# Patient Record
Sex: Male | Born: 1940 | Race: Black or African American | Hispanic: No | Marital: Married
Health system: Southern US, Community
[De-identification: ages and names within clinical notes are randomized; demographics above are authoritative.]

## PROBLEM LIST (undated history)

## (undated) ENCOUNTER — Emergency Department (HOSPITAL_COMMUNITY): Admission: EM | Payer: Self-pay | Source: Home / Self Care

---

## 2016-10-23 ENCOUNTER — Inpatient Hospital Stay
Admission: RE | Admit: 2016-10-23 | Discharge: 2016-11-11 | Disposition: A | Discharge status: Skilled Nursing Facility | Payer: Self-pay | Source: Other Acute Inpatient Hospital | Attending: Internal Medicine | Admitting: Internal Medicine

## 2016-10-23 DIAGNOSIS — J189 Pneumonia, unspecified organism: Secondary | ICD-10-CM

## 2016-10-23 DIAGNOSIS — J9690 Respiratory failure, unspecified, unspecified whether with hypoxia or hypercapnia: Secondary | ICD-10-CM

## 2016-10-23 DIAGNOSIS — Z992 Dependence on renal dialysis: Secondary | ICD-10-CM

## 2016-10-24 ENCOUNTER — Other Ambulatory Visit (HOSPITAL_COMMUNITY): Payer: Self-pay

## 2016-10-24 LAB — CBC WITH DIFFERENTIAL/PLATELET
Basophils Absolute: 0 10*3/uL (ref 0.0–0.1)
Basophils Relative: 0 %
Eosinophils Absolute: 0 10*3/uL (ref 0.0–0.7)
Eosinophils Relative: 0 %
HCT: 24.1 % — ABNORMAL LOW (ref 39.0–52.0)
Hemoglobin: 7.6 g/dL — ABNORMAL LOW (ref 13.0–17.0)
Lymphocytes Relative: 6 %
Lymphs Abs: 0.3 10*3/uL — ABNORMAL LOW (ref 0.7–4.0)
MCH: 29.8 pg (ref 26.0–34.0)
MCHC: 31.5 g/dL (ref 30.0–36.0)
MCV: 94.5 fL (ref 78.0–100.0)
Monocytes Absolute: 0.2 10*3/uL (ref 0.1–1.0)
Monocytes Relative: 4 %
Neutro Abs: 4.3 10*3/uL (ref 1.7–7.7)
Neutrophils Relative %: 90 %
Platelets: 80 10*3/uL — ABNORMAL LOW (ref 150–400)
RBC: 2.55 MIL/uL — ABNORMAL LOW (ref 4.22–5.81)
RDW: 14.3 % (ref 11.5–15.5)
WBC: 4.9 10*3/uL (ref 4.0–10.5)

## 2016-10-24 LAB — BASIC METABOLIC PANEL
Anion gap: 8 (ref 5–15)
BUN: 26 mg/dL — ABNORMAL HIGH (ref 6–20)
CO2: 31 mmol/L (ref 22–32)
Calcium: 8.8 mg/dL — ABNORMAL LOW (ref 8.9–10.3)
Chloride: 97 mmol/L — ABNORMAL LOW (ref 101–111)
Creatinine, Ser: 1.6 mg/dL — ABNORMAL HIGH (ref 0.61–1.24)
GFR calc Af Amer: 47 mL/min — ABNORMAL LOW (ref >60)
GFR calc non Af Amer: 41 mL/min — ABNORMAL LOW (ref >60)
Glucose, Bld: 122 mg/dL — ABNORMAL HIGH (ref 65–99)
Potassium: 4.3 mmol/L (ref 3.5–5.1)
Sodium: 136 mmol/L (ref 135–145)

## 2016-10-25 LAB — RENAL FUNCTION PANEL
ANION GAP: 7 (ref 5–15)
Albumin: 2.6 g/dL — ABNORMAL LOW (ref 3.5–5.0)
BUN: 42 mg/dL — ABNORMAL HIGH (ref 6–20)
CALCIUM: 9 mg/dL (ref 8.9–10.3)
CHLORIDE: 96 mmol/L — AB (ref 101–111)
CO2: 32 mmol/L (ref 22–32)
Creatinine, Ser: 2.4 mg/dL — ABNORMAL HIGH (ref 0.61–1.24)
GFR, EST AFRICAN AMERICAN: 29 mL/min — AB (ref >60)
GFR, EST NON AFRICAN AMERICAN: 25 mL/min — AB (ref >60)
Glucose, Bld: 132 mg/dL — ABNORMAL HIGH (ref 65–99)
Phosphorus: 3.8 mg/dL (ref 2.5–4.6)
Potassium: 4.4 mmol/L (ref 3.5–5.1)
Sodium: 135 mmol/L (ref 135–145)

## 2016-10-25 LAB — CBC
HCT: 26.3 % — ABNORMAL LOW (ref 39.0–52.0)
Hemoglobin: 8.3 g/dL — ABNORMAL LOW (ref 13.0–17.0)
MCH: 30 pg (ref 26.0–34.0)
MCHC: 31.6 g/dL (ref 30.0–36.0)
MCV: 94.9 fL (ref 78.0–100.0)
Platelets: 81 10*3/uL — ABNORMAL LOW (ref 150–400)
RBC: 2.77 MIL/uL — ABNORMAL LOW (ref 4.22–5.81)
RDW: 14.3 % (ref 11.5–15.5)
WBC: 5.6 10*3/uL (ref 4.0–10.5)

## 2016-10-25 LAB — HEMOGLOBIN A1C
HEMOGLOBIN A1C: 5.3 % (ref 4.8–5.6)
MEAN PLASMA GLUCOSE: 105.41 mg/dL

## 2016-10-25 LAB — MAGNESIUM: MAGNESIUM: 1.9 mg/dL (ref 1.7–2.4)

## 2016-10-25 LAB — TSH: TSH: 0.52 u[IU]/mL (ref 0.350–4.500)

## 2016-10-26 LAB — RENAL FUNCTION PANEL
ALBUMIN: 2.5 g/dL — AB (ref 3.5–5.0)
Anion gap: 8 (ref 5–15)
BUN: 62 mg/dL — AB (ref 6–20)
CHLORIDE: 98 mmol/L — AB (ref 101–111)
CO2: 29 mmol/L (ref 22–32)
Calcium: 9.1 mg/dL (ref 8.9–10.3)
Creatinine, Ser: 2.86 mg/dL — ABNORMAL HIGH (ref 0.61–1.24)
GFR calc Af Amer: 23 mL/min — ABNORMAL LOW (ref >60)
GFR calc non Af Amer: 20 mL/min — ABNORMAL LOW (ref >60)
GLUCOSE: 180 mg/dL — AB (ref 65–99)
POTASSIUM: 4.8 mmol/L (ref 3.5–5.1)
Phosphorus: 4 mg/dL (ref 2.5–4.6)
Sodium: 135 mmol/L (ref 135–145)

## 2016-10-26 LAB — CBC
HCT: 27.3 % — ABNORMAL LOW (ref 39.0–52.0)
Hemoglobin: 8.7 g/dL — ABNORMAL LOW (ref 13.0–17.0)
MCH: 30.1 pg (ref 26.0–34.0)
MCHC: 31.9 g/dL (ref 30.0–36.0)
MCV: 94.5 fL (ref 78.0–100.0)
PLATELETS: 83 10*3/uL — AB (ref 150–400)
RBC: 2.89 MIL/uL — ABNORMAL LOW (ref 4.22–5.81)
RDW: 14.7 % (ref 11.5–15.5)
WBC: 5.2 10*3/uL (ref 4.0–10.5)

## 2016-10-26 LAB — MAGNESIUM: Magnesium: 1.8 mg/dL (ref 1.7–2.4)

## 2016-10-26 NOTE — Consult Note (Signed)
[facility] CONSULT NOTE    Date: 10/26/2016                  Patient Name:  Tommy Molina  MRN: [id number]  DOB: 1940/03/01  Age / Sex: 76 y.o., male         PCP: No primary care provider on file.                 Service Requesting Consult: Hospitalist                  Reason for Consult: Acute renal failure             History of Present Illness: Patient is a 76 y.o. 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 Patient was seen at outside hospital initially with substernal chest pain and shortness of breath. He has history of COPD. He had recent left big toe surgery secondary to osteomyelitis and was on cefepime. He subsequently was found to have pulmonary infiltrate. Patient was started on high-dose steroids for the acute eosinophilic pneumonia with good response. Eosinophilic pneumonia was felt to be secondary to daptomycin. He also developed acute kidney injury which could have been acute tubular necrosis versus acute interstitial nephritis. His baseline creatinine appears to be 1.02 from 09/09/2016. He had a right internal jugular PermCath placed for dialysis treatments. Output appears to be increasing at the moment.  Medications: Current medications: Pepcid 20 mg twice a day, Solu-Medrol 125 mg IV every 6 hours, meropenem 500 mg daily, albuterol 3 ML's inhaled 4 times a day, heparin 5000 units subcutaneous every 8 hours, PhosLo 667 mg by mouth 3 times a day, BuSpar 5 mg 3 times a day, senna 1 tablet by mouth twice a day, multivitamin 1 tablet daily, Flomax 0.4 mg daily, MiraLAX 17 g by mouth daily, breo ellipta 100/29mg 1 puff inhaled daily.  Allergies: Aspirin, daptomycin which caused probable eosinophilic pneumonia, naproxen   Past  Medical History: acute eosinophilic pneumonia secondary to daptomycin, multifocal pneumonia, acute respiratory failure, acute kidney injury on chronic kidney disease, and COPD  Past Surgical History: Left toe surgery PermCath placement   Family History: Positive for CHF and hypertension  Social History: Lives at home with his wife. No tobacco, alcohol, illicit drug use.   Review of Systems: Review of Systems  Constitutional: Positive for malaise/fatigue. Negative for chills and fever.  HENT: Positive for hearing loss. Negative for congestion, nosebleeds and tinnitus.   Eyes: Negative for blurred vision.  Respiratory: Positive for cough and shortness of breath.   Cardiovascular: Positive for orthopnea. Negative for chest pain and palpitations.  Gastrointestinal: Negative for heartburn and vomiting.  Genitourinary: Negative for dysuria, frequency and urgency.  Musculoskeletal: Positive for back pain. Negative for myalgias.  Skin: Negative for itching and rash.  Neurological: Negative for dizziness and focal weakness.  Endo/Heme/Allergies: Negative for polydipsia. Does not bruise/bleed easily.  Psychiatric/Behavioral: Negative for depression. The patient is not nervous/anxious.     Vital Signs: There were no vitals taken for this visit.  Weight trends: There were no vitals filed for this visit.  Physical Exam: General: NAD, laying in bed  Head: Normocephalic, atraumatic.  Eyes: Anicteric, EOMI  Nose: Mucous membranes moist, not inflammed, nonerythematous.  Throat: Oropharynx nonerythematous, no exudate appreciated.   Neck:  Supple, trachea midline.  Lungs:  Scattered rhonchi, normal effort  Heart: RRR. S1 and S2 normal without gallop, murmur, or rubs.  Abdomen:  BS normoactive. Soft, Nondistended, non-tender.  No masses or organomegaly.  Extremities: 1+ b/l LE edema.  Neurologic: A&O X3, Motor strength is 5/5 in the all 4 extremities  Skin: No visible rashes, scars.     Lab results: Basic Metabolic Panel:  Recent Labs Lab 10/24/16 0515 10/25/16 0603 10/26/16 0512  NA 136 135 135  K 4.3 4.4 4.8  CL 97* 96* 98*  CO2 31 32 29  GLUCOSE 122* 132* 180*  BUN 26* 42* 62*  CREATININE 1.60* 2.40* 2.86*  CALCIUM 8.8* 9.0 9.1  MG  --  1.9 1.8  PHOS  --  3.8 4.0    Liver Function Tests:  Recent Labs Lab 10/25/16 0603 10/26/16 0512  ALBUMIN 2.6* 2.5*   No results for input(s): LIPASE, AMYLASE in the last 168 hours. No results for input(s): AMMONIA in the last 168 hours.  CBC:  Recent Labs Lab 10/24/16 0515 10/25/16 0603 10/26/16 0512  WBC 4.9 5.6 5.2  NEUTROABS 4.3  --   --   HGB 7.6* 8.3* 8.7*  HCT 24.1* 26.3* 27.3*  MCV 94.5 94.9 94.5  PLT 80* 81* 83*    Cardiac Enzymes: No results for input(s): CKTOTAL, CKMB, CKMBINDEX, TROPONINI in the last 168 hours.  BNP: Invalid input(s): POCBNP  CBG: No results for input(s): GLUCAP in the last 168 hours.  Microbiology: No results found for this or any previous visit.  Coagulation Studies: No results for input(s): LABPROT, INR in the last 72 hours.  Urinalysis: No results for input(s): COLORURINE, LABSPEC, PHURINE, GLUCOSEU, HGBUR, BILIRUBINUR, KETONESUR, PROTEINUR, UROBILINOGEN, NITRITE, LEUKOCYTESUR in the last 72 hours.  Invalid input(s): APPERANCEUR    Imaging:  No results found.   Assessment & Plan: Pt is a 76 y.o. 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  1. Acute renal failure. This is felt to be secondary to acute tubular necrosis versus allergic interstitial nephritis possibly due to daptomycin. Patient states that his urine output is increasing.  The patient's creatinine is currently up to 2.86 with an EGFR of 20. Hold off on dialysis for  now. Reassess for need on Wednesday.  2. Anemia unspecified. Hemoglobin currently 8.7. It has been as low as 7.6. Continue to monitor CBC.  3. Secondary hyperparathyroidism. Patient was started on PhosLo at the outside hospital. Continue to periodically monitor serum phosphorus, calcium. Okay to continue calcium acetate for now.  4. Thanks for consultation.

## 2016-10-28 LAB — COMPREHENSIVE METABOLIC PANEL
ALT: 52 U/L (ref 17–63)
ANION GAP: 8 (ref 5–15)
AST: 23 U/L (ref 15–41)
Albumin: 2.6 g/dL — ABNORMAL LOW (ref 3.5–5.0)
Alkaline Phosphatase: 105 U/L (ref 38–126)
BUN: 110 mg/dL — AB (ref 6–20)
CALCIUM: 9.3 mg/dL (ref 8.9–10.3)
CO2: 31 mmol/L (ref 22–32)
Chloride: 99 mmol/L — ABNORMAL LOW (ref 101–111)
Creatinine, Ser: 2.61 mg/dL — ABNORMAL HIGH (ref 0.61–1.24)
GFR, EST AFRICAN AMERICAN: 26 mL/min — AB (ref >60)
GFR, EST NON AFRICAN AMERICAN: 22 mL/min — AB (ref >60)
Glucose, Bld: 154 mg/dL — ABNORMAL HIGH (ref 65–99)
Potassium: 5.2 mmol/L — ABNORMAL HIGH (ref 3.5–5.1)
SODIUM: 138 mmol/L (ref 135–145)
Total Bilirubin: 0.6 mg/dL (ref 0.3–1.2)
Total Protein: 5.1 g/dL — ABNORMAL LOW (ref 6.5–8.1)

## 2016-10-28 LAB — CBC
HCT: 25.9 % — ABNORMAL LOW (ref 39.0–52.0)
Hemoglobin: 8.3 g/dL — ABNORMAL LOW (ref 13.0–17.0)
MCH: 30 pg (ref 26.0–34.0)
MCHC: 32 g/dL (ref 30.0–36.0)
MCV: 93.5 fL (ref 78.0–100.0)
PLATELETS: 107 10*3/uL — AB (ref 150–400)
RBC: 2.77 MIL/uL — AB (ref 4.22–5.81)
RDW: 14.6 % (ref 11.5–15.5)
WBC: 7.3 10*3/uL (ref 4.0–10.5)

## 2016-10-28 NOTE — Progress Notes (Signed)
[facility]  ROUNDING NOTE   Subjective:  Patient with worsening respiratory status today. Next line he also has increasing lower extremity edema. Therefore we have decided to proceed with dialysis today.   Objective:  Vital signs in last 24 hours:  Temperature 96.8 pulse 104 respirations 20 blood pressure 123/59 Physical Exam: General: Increased work of breathing   Head: Normocephalic, atraumatic. Moist oral mucosal membranes  Eyes: Anicteric  Neck: Supple, trachea midline  Lungs:  Bilateral rales, increased work of breathing   Heart: S1S2 no rubs  Abdomen:  Soft, nontender, bowel sounds present  Extremities: 2+ peripheral edema.  Neurologic: Awake, alert, following commands  Skin: No lesions  Access: permcath in place    Basic Metabolic Panel:  Recent Labs Lab 10/24/16 0515 10/25/16 0603 10/26/16 0512 10/28/16 1250  NA 136 135 135 138  K 4.3 4.4 4.8 5.2*  CL 97* 96* 98* 99*  CO2 31 32 29 31  GLUCOSE 122* 132* 180* 154*  BUN 26* 42* 62* 110*  CREATININE 1.60* 2.40* 2.86* 2.61*  CALCIUM 8.8* 9.0 9.1 9.3  MG  --  1.9 1.8  --   PHOS  --  3.8 4.0  --     Liver Function Tests:  Recent Labs Lab 10/25/16 0603 10/26/16 0512 10/28/16 1250  AST  --   --  23  ALT  --   --  52  ALKPHOS  --   --  105  BILITOT  --   --  0.6  PROT  --   --  5.1*  ALBUMIN 2.6* 2.5* 2.6*   No results for input(s): LIPASE, AMYLASE in the last 168 hours. No results for input(s): AMMONIA in the last 168 hours.  CBC:  Recent Labs Lab 10/24/16 0515 10/25/16 0603 10/26/16 0512 10/28/16 1250  WBC 4.9 5.6 5.2 7.3  NEUTROABS 4.3  --   --   --   HGB 7.6* 8.3* 8.7* 8.3*  HCT 24.1* 26.3* 27.3* 25.9*  MCV 94.5 94.9 94.5 93.5  PLT 80* 81* 83* 107*    Cardiac Enzymes: No results for input(s): CKTOTAL, CKMB, CKMBINDEX, TROPONINI in the last 168 hours.  BNP: Invalid input(s): POCBNP  CBG: No results for input(s): GLUCAP in the last 168 hours.  Microbiology: No  results found for this or any previous visit.  Coagulation Studies: No results for input(s): LABPROT, INR in the last 72 hours.  Urinalysis: No results for input(s): COLORURINE, LABSPEC, PHURINE, GLUCOSEU, HGBUR, BILIRUBINUR, KETONESUR, PROTEINUR, UROBILINOGEN, NITRITE, LEUKOCYTESUR in the last 72 hours.  Invalid input(s): APPERANCEUR    Imaging: No results found.   Medications:       Assessment/ Plan:  76 y.o. 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  1. Acute renal failure.  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 - Renal function worsening. We're hoping to keep the patient off of dialysis however it appears that he needs dialysis at this time. We will plan for a short dialysis treatment of 2 hours today with ultrafiltration target of 11.5 kg. We will plan for dialysis again tomorrow as well.  2. Anemia unspecified. Hemoglobin currently down to 8.3. Continue to monitor CBC.  3. Secondary hyperparathyroidism. Phosphorus 4.0 at last check. The patient is maintaining on calcium  acetate. Continue to monitor bone mineral metabolism parameters.  4. Lower extremity edema.  Ultrafiltration target 1-1.5 kg today.   LOS: 0 Tommy Molina 9/12/20182:54 PM

## 2016-10-29 LAB — HEPATITIS B SURFACE ANTIBODY,QUALITATIVE: HEP B S AB: NONREACTIVE

## 2016-10-29 LAB — RENAL FUNCTION PANEL
ANION GAP: 9 (ref 5–15)
Albumin: 2.6 g/dL — ABNORMAL LOW (ref 3.5–5.0)
BUN: 84 mg/dL — AB (ref 6–20)
CO2: 28 mmol/L (ref 22–32)
Calcium: 9 mg/dL (ref 8.9–10.3)
Chloride: 102 mmol/L (ref 101–111)
Creatinine, Ser: 1.9 mg/dL — ABNORMAL HIGH (ref 0.61–1.24)
GFR calc Af Amer: 38 mL/min — ABNORMAL LOW (ref >60)
GFR calc non Af Amer: 33 mL/min — ABNORMAL LOW (ref >60)
GLUCOSE: 119 mg/dL — AB (ref 65–99)
POTASSIUM: 5.5 mmol/L — AB (ref 3.5–5.1)
Phosphorus: 3.9 mg/dL (ref 2.5–4.6)
SODIUM: 139 mmol/L (ref 135–145)

## 2016-10-29 LAB — CBC
HCT: 26.8 % — ABNORMAL LOW (ref 39.0–52.0)
HEMOGLOBIN: 8.9 g/dL — AB (ref 13.0–17.0)
MCH: 31 pg (ref 26.0–34.0)
MCHC: 33.2 g/dL (ref 30.0–36.0)
MCV: 93.4 fL (ref 78.0–100.0)
Platelets: 109 10*3/uL — ABNORMAL LOW (ref 150–400)
RBC: 2.87 MIL/uL — ABNORMAL LOW (ref 4.22–5.81)
RDW: 14.8 % (ref 11.5–15.5)
WBC: 5.1 10*3/uL (ref 4.0–10.5)

## 2016-10-29 LAB — HEPATITIS B SURFACE ANTIGEN: Hepatitis B Surface Ag: NEGATIVE

## 2016-10-29 LAB — HEPATITIS B CORE ANTIBODY, IGM: HEP B C IGM: NEGATIVE

## 2016-10-30 LAB — CBC
HEMATOCRIT: 24.3 % — AB (ref 39.0–52.0)
HEMOGLOBIN: 7.7 g/dL — AB (ref 13.0–17.0)
MCH: 30 pg (ref 26.0–34.0)
MCHC: 31.7 g/dL (ref 30.0–36.0)
MCV: 94.6 fL (ref 78.0–100.0)
Platelets: 109 10*3/uL — ABNORMAL LOW (ref 150–400)
RBC: 2.57 MIL/uL — ABNORMAL LOW (ref 4.22–5.81)
RDW: 15 % (ref 11.5–15.5)
WBC: 3.9 10*3/uL — ABNORMAL LOW (ref 4.0–10.5)

## 2016-10-30 LAB — RENAL FUNCTION PANEL
ALBUMIN: 2.3 g/dL — AB (ref 3.5–5.0)
ANION GAP: 3 — AB (ref 5–15)
BUN: 60 mg/dL — ABNORMAL HIGH (ref 6–20)
CO2: 32 mmol/L (ref 22–32)
Calcium: 8.8 mg/dL — ABNORMAL LOW (ref 8.9–10.3)
Chloride: 105 mmol/L (ref 101–111)
Creatinine, Ser: 1.63 mg/dL — ABNORMAL HIGH (ref 0.61–1.24)
GFR calc Af Amer: 46 mL/min — ABNORMAL LOW (ref >60)
GFR, EST NON AFRICAN AMERICAN: 40 mL/min — AB (ref >60)
GLUCOSE: 78 mg/dL (ref 65–99)
PHOSPHORUS: 3.4 mg/dL (ref 2.5–4.6)
POTASSIUM: 5 mmol/L (ref 3.5–5.1)
Sodium: 140 mmol/L (ref 135–145)

## 2016-10-30 LAB — BASIC METABOLIC PANEL
Anion gap: 5 (ref 5–15)
BUN: 59 mg/dL — ABNORMAL HIGH (ref 6–20)
CHLORIDE: 103 mmol/L (ref 101–111)
CO2: 31 mmol/L (ref 22–32)
CREATININE: 1.62 mg/dL — AB (ref 0.61–1.24)
Calcium: 8.7 mg/dL — ABNORMAL LOW (ref 8.9–10.3)
GFR calc non Af Amer: 40 mL/min — ABNORMAL LOW (ref >60)
GFR, EST AFRICAN AMERICAN: 46 mL/min — AB (ref >60)
Glucose, Bld: 80 mg/dL (ref 65–99)
Potassium: 5 mmol/L (ref 3.5–5.1)
Sodium: 139 mmol/L (ref 135–145)

## 2016-10-30 NOTE — Progress Notes (Signed)
[facility]  ROUNDING NOTE   Subjective:  Patient completed hemodialysis yesterday. He tolerated this quite well. Lower extremity edema has improved. We plan for dialysis again today.   Objective:  Vital signs in last 24 hours:  Temperature 97.8 pulse 80 respirations 14 blood pressure 134/64  Physical Exam: General: No acute distress   Head: Normocephalic, atraumatic. Moist oral mucosal membranes  Eyes: Anicteric  Neck: Supple, trachea midline  Lungs:  Bilateral rales, No acute distress   Heart: S1S2 no rubs  Abdomen:  Soft, nontender, bowel sounds present  Extremities: 2+ peripheral edema.  Neurologic: Awake, alert, following commands  Skin: No lesions  Access: permcath in place    Basic Metabolic Panel:  Recent Labs Lab 10/25/16 0603 10/26/16 0512 10/28/16 1250 10/29/16 0609 10/30/16 0710  NA 135 135 138 139 139  K 4.4 4.8 5.2* 5.5* 5.0  CL 96* 98* 99* 102 103  CO2 32 GLUCOSE 132* 180* 154* 119* 80  BUN 42* 62* 110* 84* 59*  CREATININE 2.40* 2.86* 2.61* 1.90* 1.62*  CALCIUM 9.0 9.1 9.3 9.0 8.7*  MG 1.9 1.8  --   --   --   PHOS 3.8 4.0  --  3.9  --     Liver Function Tests:  Recent Labs Lab 10/25/16 0603 10/26/16 0512 10/28/16 1250 10/29/16 0609  AST  --   --  23  --   ALT  --   --  52  --   ALKPHOS  --   --  105  --   BILITOT  --   --  0.6  --   PROT  --   --  5.1*  --   ALBUMIN 2.6* 2.5* 2.6* 2.6*   No results for input(s): LIPASE, AMYLASE in the last 168 hours. No results for input(s): AMMONIA in the last 168 hours.  CBC:  Recent Labs Lab 10/24/16 0515 10/25/16 0603 10/26/16 0512 10/28/16 1250 10/29/16 0609 10/30/16 0710  WBC 4.9 5.6 5.2 7.3 5.1 3.9*  NEUTROABS 4.3  --   --   --   --   --   HGB 7.6* 8.3* 8.7* 8.3* 8.9* 7.7*  HCT 24.1* 26.3* 27.3* 25.9* 26.8* 24.3*  MCV 94.5 94.9 94.5 93.5 93.4 94.6  PLT 80* 81* 83* 107* 109* 109*    Cardiac Enzymes: No results for input(s): CKTOTAL, CKMB, CKMBINDEX,  TROPONINI in the last 168 hours.  BNP: Invalid input(s): POCBNP  CBG: No results for input(s): GLUCAP in the last 168 hours.  Microbiology: No results found for this or any previous visit.  Coagulation Studies: No results for input(s): LABPROT, INR in the last 72 hours.  Urinalysis: No results for input(s): COLORURINE, LABSPEC, PHURINE, GLUCOSEU, HGBUR, BILIRUBINUR, KETONESUR, PROTEINUR, UROBILINOGEN, NITRITE, LEUKOCYTESUR in the last 72 hours.  Invalid input(s): APPERANCEUR    Imaging: No results found.   Medications:       Assessment/ Plan:  76 y.o. 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  1. Acute renal failure.  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 - Patient status has improved significantly with reinitiation of dialysis. We will plan for another dialysis treatment today with ultra filtration target of 1.5 kg.  2. Anemia unspecified. Hemoglobin down  to 7.7 at the moment. Consider blood transfusion for hemoglobin of 7 or less.  3. Secondary hyperparathyroidism. Phosphorus well controlled at 3.9. Continue to monitor.  4. Lower extremity edema.  Overall still has significant edema but appears to be less tight. Ultrafiltration target 1.5 kg today.   LOS: 0 Amenah Tucci 9/14/201810:05 AM

## 2016-11-02 LAB — RENAL FUNCTION PANEL
Albumin: 2.4 g/dL — ABNORMAL LOW (ref 3.5–5.0)
Anion gap: 4 — ABNORMAL LOW (ref 5–15)
BUN: 44 mg/dL — ABNORMAL HIGH (ref 6–20)
CHLORIDE: 104 mmol/L (ref 101–111)
CO2: 33 mmol/L — ABNORMAL HIGH (ref 22–32)
CREATININE: 1.31 mg/dL — AB (ref 0.61–1.24)
Calcium: 8.7 mg/dL — ABNORMAL LOW (ref 8.9–10.3)
GFR, EST AFRICAN AMERICAN: 60 mL/min — AB (ref >60)
GFR, EST NON AFRICAN AMERICAN: 52 mL/min — AB (ref >60)
Glucose, Bld: 81 mg/dL (ref 65–99)
POTASSIUM: 5.1 mmol/L (ref 3.5–5.1)
Phosphorus: 3.2 mg/dL (ref 2.5–4.6)
Sodium: 141 mmol/L (ref 135–145)

## 2016-11-02 LAB — CBC
HEMATOCRIT: 23.5 % — AB (ref 39.0–52.0)
Hemoglobin: 7.7 g/dL — ABNORMAL LOW (ref 13.0–17.0)
MCH: 30.9 pg (ref 26.0–34.0)
MCHC: 32.8 g/dL (ref 30.0–36.0)
MCV: 94.4 fL (ref 78.0–100.0)
Platelets: 118 10*3/uL — ABNORMAL LOW (ref 150–400)
RBC: 2.49 MIL/uL — AB (ref 4.22–5.81)
RDW: 15.2 % (ref 11.5–15.5)
WBC: 4.3 10*3/uL (ref 4.0–10.5)

## 2016-11-02 NOTE — Progress Notes (Signed)
[facility]  ROUNDING NOTE   Subjective:  Patient seen and evaluated during hemodialysis. Per his report he is still making approximately 700 cc of urine per day.   Objective:  Vital signs in last 24 hours:  Temperature 98.7 pulse 96 respirations 14 blood pressure 153/71  Physical Exam: General: No acute distress   Head: Normocephalic, atraumatic. Moist oral mucosal membranes  Eyes: Anicteric  Neck: Supple, trachea midline  Lungs:  Clear to auscultation bilateral, normal respiratory effort   Heart: S1S2 no rubs  Abdomen:  Soft, nontender, bowel sounds present  Extremities: Trace peripheral edema.  Neurologic: Awake, alert, following commands  Skin: No lesions  Access: permcath in place    Basic Metabolic Panel:  Recent Labs Lab 10/28/16 1250 10/29/16 0609 10/30/16 0710 10/30/16 1424 11/02/16 0607  NA 138 139 139 140 141  K 5.2* 5.5* 5.0 5.0 5.1  CL 99* 102 103 105 104  CO2 32 33*  GLUCOSE 154* 119* 80 78 81  BUN 110* 84* 59* 60* 44*  CREATININE 2.61* 1.90* 1.62* 1.63* 1.31*  CALCIUM 9.3 9.0 8.7* 8.8* 8.7*  PHOS  --  3.9  --  3.4 3.2    Liver Function Tests:  Recent Labs Lab 10/28/16 1250 10/29/16 0609 10/30/16 1424 11/02/16 0607  AST 23  --   --   --   ALT 52  --   --   --   ALKPHOS 105  --   --   --   BILITOT 0.6  --   --   --   PROT 5.1*  --   --   --   ALBUMIN 2.6* 2.6* 2.3* 2.4*   No results for input(s): LIPASE, AMYLASE in the last 168 hours. No results for input(s): AMMONIA in the last 168 hours.  CBC:  Recent Labs Lab 10/28/16 1250 10/29/16 0609 10/30/16 0710 11/02/16 0542  WBC 7.3 5.1 3.9* 4.3  HGB 8.3* 8.9* 7.7* 7.7*  HCT 25.9* 26.8* 24.3* 23.5*  MCV 93.5 93.4 94.6 94.4  PLT 107* 109* 109* 118*    Cardiac Enzymes: No results for input(s): CKTOTAL, CKMB, CKMBINDEX, TROPONINI in the last 168 hours.  BNP: Invalid input(s): POCBNP  CBG: No results for input(s): GLUCAP in the last 168  hours.  Microbiology: No results found for this or any previous visit.  Coagulation Studies: No results for input(s): LABPROT, INR in the last 72 hours.  Urinalysis: No results for input(s): COLORURINE, LABSPEC, PHURINE, GLUCOSEU, HGBUR, BILIRUBINUR, KETONESUR, PROTEINUR, UROBILINOGEN, NITRITE, LEUKOCYTESUR in the last 72 hours.  Invalid input(s): APPERANCEUR    Imaging: No results found.   Medications:       Assessment/ Plan:  76 y.o. 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  1. Acute renal failure.  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 - BUN and creatinine trending down under the influence of dialysis.  Still making little urine however. Tentatively we will plan for another dialysis session on Wednesday with ultrafiltration target of 1 kg. Thereafter we may hold further dialysis treatment.  2. Anemia unspecified. Hemoglobin 7.7 at last check. No urgent indication for transfusion at the moment.  3. Secondary hyperparathyroidism. Phosphorus at target at 3.2 with a calcium of 8.7. Continue to monitor.  4. Lower extremity edema. Remarkably improved. We will plan for 1 additional dialysis session with ultrafiltration on Wednesday.   LOS: 0 Diontae Route 9/17/20185:06 PM

## 2016-11-04 LAB — RENAL FUNCTION PANEL
ANION GAP: 4 — AB (ref 5–15)
Albumin: 2.8 g/dL — ABNORMAL LOW (ref 3.5–5.0)
BUN: 28 mg/dL — AB (ref 6–20)
CHLORIDE: 102 mmol/L (ref 101–111)
CO2: 33 mmol/L — ABNORMAL HIGH (ref 22–32)
Calcium: 8.9 mg/dL (ref 8.9–10.3)
Creatinine, Ser: 1.09 mg/dL (ref 0.61–1.24)
GFR calc Af Amer: >60 mL/min (ref >60)
Glucose, Bld: 96 mg/dL (ref 65–99)
PHOSPHORUS: 2.7 mg/dL (ref 2.5–4.6)
POTASSIUM: 4.1 mmol/L (ref 3.5–5.1)
Sodium: 139 mmol/L (ref 135–145)

## 2016-11-04 LAB — CBC
HCT: 25.5 % — ABNORMAL LOW (ref 39.0–52.0)
HEMOGLOBIN: 8 g/dL — AB (ref 13.0–17.0)
MCH: 29.9 pg (ref 26.0–34.0)
MCHC: 31.4 g/dL (ref 30.0–36.0)
MCV: 95.1 fL (ref 78.0–100.0)
Platelets: 162 10*3/uL (ref 150–400)
RBC: 2.68 MIL/uL — ABNORMAL LOW (ref 4.22–5.81)
RDW: 15.2 % (ref 11.5–15.5)
WBC: 7.4 10*3/uL (ref 4.0–10.5)

## 2016-11-04 NOTE — Progress Notes (Signed)
[facility]  ROUNDING NOTE   Subjective:  Creatinine continues to drop. Next line BUN currently 28 with a creatinine 1.09. He did undergo dialysis today.   Objective:  Vital signs in last 24 hours:  Temperature 97.6 pulse 88 respirations 18 blood pressure 114/50  Physical Exam: General: No acute distress   Head: Normocephalic, atraumatic. Moist oral mucosal membranes  Eyes: Anicteric  Neck: Supple, trachea midline  Lungs:  Clear to auscultation bilateral, normal respiratory effort   Heart: S1S2 no rubs  Abdomen:  Soft, nontender, bowel sounds present  Extremities: Trace peripheral edema.  Neurologic: Awake, alert, following commands  Skin: No lesions  Access: permcath in place    Basic Metabolic Panel:  Recent Labs Lab 10/29/16 0609 10/30/16 0710 10/30/16 1424 11/02/16 0607 11/04/16 0618  NA 139 139 140 141 139  K 5.5* 5.0 5.0 5.1 4.1  CL 102 103 105 104 102  CO2 28 31 32 33* 33*  GLUCOSE 119* 80 78 81 96  BUN 84* 59* 60* 44* 28*  CREATININE 1.90* 1.62* 1.63* 1.31* 1.09  CALCIUM 9.0 8.7* 8.8* 8.7* 8.9  PHOS 3.9  --  3.4 3.2 2.7    Liver Function Tests:  Recent Labs Lab 10/28/16 1250 10/29/16 0609 10/30/16 1424 11/02/16 0607 11/04/16 0618  AST 23  --   --   --   --   ALT 52  --   --   --   --   ALKPHOS 105  --   --   --   --   BILITOT 0.6  --   --   --   --   PROT 5.1*  --   --   --   --   ALBUMIN 2.6* 2.6* 2.3* 2.4* 2.8*   No results for input(s): LIPASE, AMYLASE in the last 168 hours. No results for input(s): AMMONIA in the last 168 hours.  CBC:  Recent Labs Lab 10/28/16 1250 10/29/16 0609 10/30/16 0710 11/02/16 0542 11/04/16 0618  WBC 7.3 5.1 3.9* 4.3 7.4  HGB 8.3* 8.9* 7.7* 7.7* 8.0*  HCT 25.9* 26.8* 24.3* 23.5* 25.5*  MCV 93.5 93.4 94.6 94.4 95.1  PLT 107* 109* 109* 118* 162    Cardiac Enzymes: No results for input(s): CKTOTAL, CKMB, CKMBINDEX, TROPONINI in the last 168 hours.  BNP: Invalid input(s):  POCBNP  CBG: No results for input(s): GLUCAP in the last 168 hours.  Microbiology: No results found for this or any previous visit.  Coagulation Studies: No results for input(s): LABPROT, INR in the last 72 hours.  Urinalysis: No results for input(s): COLORURINE, LABSPEC, PHURINE, GLUCOSEU, HGBUR, BILIRUBINUR, KETONESUR, PROTEINUR, UROBILINOGEN, NITRITE, LEUKOCYTESUR in the last 72 hours.  Invalid input(s): APPERANCEUR    Imaging: No results found.   Medications:       Assessment/ Plan:  76 y.o. 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  1. Acute renal failure.  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 - BUN and creatinine continue to trend down. Creatinine down to 1.09. The patient is making some urine. We will hold further dialysis treatment for now.  2. Anemia unspecified. Hemoglobin currently 8.0. Hold off on Aranesp for now.  3. Secondary hyperparathyroidism. Phosphorus acceptable at 2.7. Continue to monitor.  4. Lower  extremity edema. Significantly improved with dialysis. We will continue to monitor clinically.   LOS: 0 Sanjith Siwek 9/19/201811:45 AM

## 2016-11-06 LAB — RENAL FUNCTION PANEL
ALBUMIN: 2.8 g/dL — AB (ref 3.5–5.0)
ANION GAP: 8 (ref 5–15)
BUN: 33 mg/dL — ABNORMAL HIGH (ref 6–20)
CALCIUM: 9.1 mg/dL (ref 8.9–10.3)
CO2: 29 mmol/L (ref 22–32)
Chloride: 103 mmol/L (ref 101–111)
Creatinine, Ser: 1.03 mg/dL (ref 0.61–1.24)
GFR calc non Af Amer: >60 mL/min (ref >60)
Glucose, Bld: 104 mg/dL — ABNORMAL HIGH (ref 65–99)
PHOSPHORUS: 2.5 mg/dL (ref 2.5–4.6)
POTASSIUM: 3.8 mmol/L (ref 3.5–5.1)
SODIUM: 140 mmol/L (ref 135–145)

## 2016-11-06 LAB — CBC
HEMATOCRIT: 23.4 % — AB (ref 39.0–52.0)
HEMOGLOBIN: 7.6 g/dL — AB (ref 13.0–17.0)
MCH: 31 pg (ref 26.0–34.0)
MCHC: 32.5 g/dL (ref 30.0–36.0)
MCV: 95.5 fL (ref 78.0–100.0)
Platelets: 136 10*3/uL — ABNORMAL LOW (ref 150–400)
RBC: 2.45 MIL/uL — AB (ref 4.22–5.81)
RDW: 15.7 % — ABNORMAL HIGH (ref 11.5–15.5)
WBC: 5.6 10*3/uL (ref 4.0–10.5)

## 2016-11-06 NOTE — Progress Notes (Signed)
  [facility]  ROUNDING NOTE   Subjective:  Renal function significantly improved. Patient states that he is still making urine. Creatinine down to 1.03.    Objective:  Vital signs in last 24 hours:  Temperature 90.8 pulse 87 respirations 22 blood pressure 126/66  Physical Exam: General: No acute distress   Head: Normocephalic, atraumatic. Moist oral mucosal membranes  Eyes: Anicteric  Neck: Supple, trachea midline  Lungs:  Clear to auscultation bilateral, normal respiratory effort   Heart: S1S2 no rubs  Abdomen:  Soft, nontender, bowel sounds present  Extremities: Trace peripheral edema.  Neurologic: Awake, alert, following commands  Skin: No lesions  Access: permcath in place    Basic Metabolic Panel:  Recent Labs Lab 11/02/16 0607 11/04/16 0618 11/06/16 0609  NA 141 139 140  K 5.1 4.1 3.8  CL 104 102 103  CO2 33* 33* 29  GLUCOSE 81 96 104*  BUN 44* 28* 33*  CREATININE 1.31* 1.09 1.03  CALCIUM 8.7* 8.9 9.1  PHOS 3.2 2.7 2.5    Liver Function Tests:  Recent Labs Lab 11/02/16 0607 11/04/16 0618 11/06/16 0609  ALBUMIN 2.4* 2.8* 2.8*   No results for input(s): LIPASE, AMYLASE in the last 168 hours. No results for input(s): AMMONIA in the last 168 hours.  CBC:  Recent Labs Lab 11/02/16 0542 11/04/16 0618 11/06/16 0607  WBC 4.3 7.4 5.6  HGB 7.7* 8.0* 7.6*  HCT 23.5* 25.5* 23.4*  MCV 94.4 95.1 95.5  PLT 118* 162 136*    Cardiac Enzymes: No results for input(s): CKTOTAL, CKMB, CKMBINDEX, TROPONINI in the last 168 hours.  BNP: Invalid input(s): POCBNP  CBG: No results for input(s): GLUCAP in the last 168 hours.  Microbiology: No results found for this or any previous visit.  Coagulation Studies: No results for input(s): LABPROT, INR in the last 72 hours.  Urinalysis: No results for input(s): COLORURINE, LABSPEC, PHURINE, GLUCOSEU, HGBUR, BILIRUBINUR, KETONESUR, PROTEINUR, UROBILINOGEN, NITRITE, LEUKOCYTESUR in the last 72  hours.  Invalid input(s): APPERANCEUR    Imaging: No results found.   Medications:       Assessment/ Plan:  76 y.o. 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  1. Acute renal failure.  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 - Renal function dramatically improved this week. Creatinine down to 1.0. It will be reasonable to discontinue PermCath on Monday.  2. Anemia unspecified. Hemoglobin down to 7.6. Continue to monitor. Transfuse for hemoglobin of 7 or less.  3. Secondary hyperparathyroidism. Also largely improved. Continue to periodically monitor phosphorus.  4. Lower extremity edema. Very little edema on exam now. This has also significantly improve with dialysis as well as improve renal function.   LOS: 0 Nilsa Macht 9/21/20186:29 PM

## 2016-11-09 ENCOUNTER — Other Ambulatory Visit (HOSPITAL_COMMUNITY): Payer: Self-pay

## 2016-11-09 ENCOUNTER — Encounter (HOSPITAL_COMMUNITY): Payer: Self-pay | Admitting: Student

## 2016-11-09 HISTORY — PX: IR REMOVAL TUN CV CATH W/O FL: IMG2289

## 2016-11-09 LAB — CBC
HCT: 24.6 % — ABNORMAL LOW (ref 39.0–52.0)
Hemoglobin: 7.8 g/dL — ABNORMAL LOW (ref 13.0–17.0)
MCH: 30.6 pg (ref 26.0–34.0)
MCHC: 31.7 g/dL (ref 30.0–36.0)
MCV: 96.5 fL (ref 78.0–100.0)
PLATELETS: 125 10*3/uL — AB (ref 150–400)
RBC: 2.55 MIL/uL — AB (ref 4.22–5.81)
RDW: 16 % — AB (ref 11.5–15.5)
WBC: 5.1 10*3/uL (ref 4.0–10.5)

## 2016-11-09 LAB — RENAL FUNCTION PANEL
Albumin: 2.8 g/dL — ABNORMAL LOW (ref 3.5–5.0)
Anion gap: 10 (ref 5–15)
BUN: 40 mg/dL — AB (ref 6–20)
CHLORIDE: 97 mmol/L — AB (ref 101–111)
CO2: 34 mmol/L — ABNORMAL HIGH (ref 22–32)
Calcium: 9.2 mg/dL (ref 8.9–10.3)
Creatinine, Ser: 1.42 mg/dL — ABNORMAL HIGH (ref 0.61–1.24)
GFR calc Af Amer: 54 mL/min — ABNORMAL LOW (ref >60)
GFR, EST NON AFRICAN AMERICAN: 47 mL/min — AB (ref >60)
Glucose, Bld: 89 mg/dL (ref 65–99)
POTASSIUM: 4.2 mmol/L (ref 3.5–5.1)
Phosphorus: 3.7 mg/dL (ref 2.5–4.6)
Sodium: 141 mmol/L (ref 135–145)

## 2016-11-09 LAB — MAGNESIUM: Magnesium: 1.8 mg/dL (ref 1.7–2.4)

## 2016-11-09 MED ORDER — LIDOCAINE HCL (PF) 1 % IJ SOLN
INTRAMUSCULAR | Status: AC
  Filled 2016-11-09: qty 10

## 2016-11-09 MED ORDER — CHLORHEXIDINE GLUCONATE 4 % EX LIQD
CUTANEOUS | Status: AC
  Filled 2016-11-09: qty 15

## 2016-11-09 MED ORDER — LIDOCAINE HCL (PF) 2 % IJ SOLN
INTRAMUSCULAR | Status: AC
  Filled 2016-11-09: qty 10

## 2016-11-11 NOTE — Progress Notes (Signed)
  [facility]  ROUNDING NOTE   Subjective:  Patient has done well from the perspective of underlying acute renal failure. PermCath has been removed.    Objective:  Vital signs in last 24 hours:  Temperature 90.8 pulse 87 respirations 22 blood pressure 126/66  Physical Exam: General: No acute distress   Head: Normocephalic, atraumatic. Moist oral mucosal membranes  Eyes: Anicteric  Neck: Supple, trachea midline  Lungs:  Clear to auscultation bilateral, normal respiratory effort   Heart: S1S2 no rubs  Abdomen:  Soft, nontender, bowel sounds present  Extremities: Trace peripheral edema.  Neurologic: Awake, alert, following commands  Skin: No lesions  Access: permcath discontinuned    Basic Metabolic Panel:  Recent Labs Lab 11/06/16 0609 11/09/16 0634  NA 140 141  K 3.8 4.2  CL 103 97*  CO2 29 34*  GLUCOSE 104* 89  BUN 33* 40*  CREATININE 1.03 1.42*  CALCIUM 9.1 9.2  MG  --  1.8  PHOS 2.5 3.7    Liver Function Tests:  Recent Labs Lab 11/06/16 0609 11/09/16 0634  ALBUMIN 2.8* 2.8*   No results for input(s): LIPASE, AMYLASE in the last 168 hours. No results for input(s): AMMONIA in the last 168 hours.  CBC:  Recent Labs Lab 11/06/16 0607 11/09/16 0634  WBC 5.6 5.1  HGB 7.6* 7.8*  HCT 23.4* 24.6*  MCV 95.5 96.5  PLT 136* 125*    Cardiac Enzymes: No results for input(s): CKTOTAL, CKMB, CKMBINDEX, TROPONINI in the last 168 hours.  BNP: Invalid input(s): POCBNP  CBG: No results for input(s): GLUCAP in the last 168 hours.  Microbiology: No results found for this or any previous visit.  Coagulation Studies: No results for input(s): LABPROT, INR in the last 72 hours.  Urinalysis: No results for input(s): COLORURINE, LABSPEC, PHURINE, GLUCOSEU, HGBUR, BILIRUBINUR, KETONESUR, PROTEINUR, UROBILINOGEN, NITRITE, LEUKOCYTESUR in the last 72 hours.  Invalid input(s): APPERANCEUR    Imaging: No results found.   Medications:        Assessment/ Plan:  76 y.o. 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  1. Acute renal failure.  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 -  renal function dramatically improved since admission. At last check BUN was 40 with a cranial 1.4. He will need continued monitoring of renal function as an outpatient.  2. Anemia unspecified. Most recent hemoglobin was up to 7.8. Continue to monitor this as an outpatient.  3. Secondary hyperparathyroidism. Ost recent phosphorus on 11/09/2016 was 3.7 and acceptable.  4. Lower extremity edema.  Significantly improved previously with dialysis as well as return of renal function.   LOS: 0 Layal Javid 9/26/20183:51 PM

## 2019-05-18 DEATH — deceased

## 2019-06-13 IMAGING — DX DG CHEST 1V PORT
1 series · 1 of 1 positions shown · non-contrast
Comparison: None.

CLINICAL DATA: Respiratory failure.

EXAM:
PORTABLE CHEST 1 VIEW

[chest]
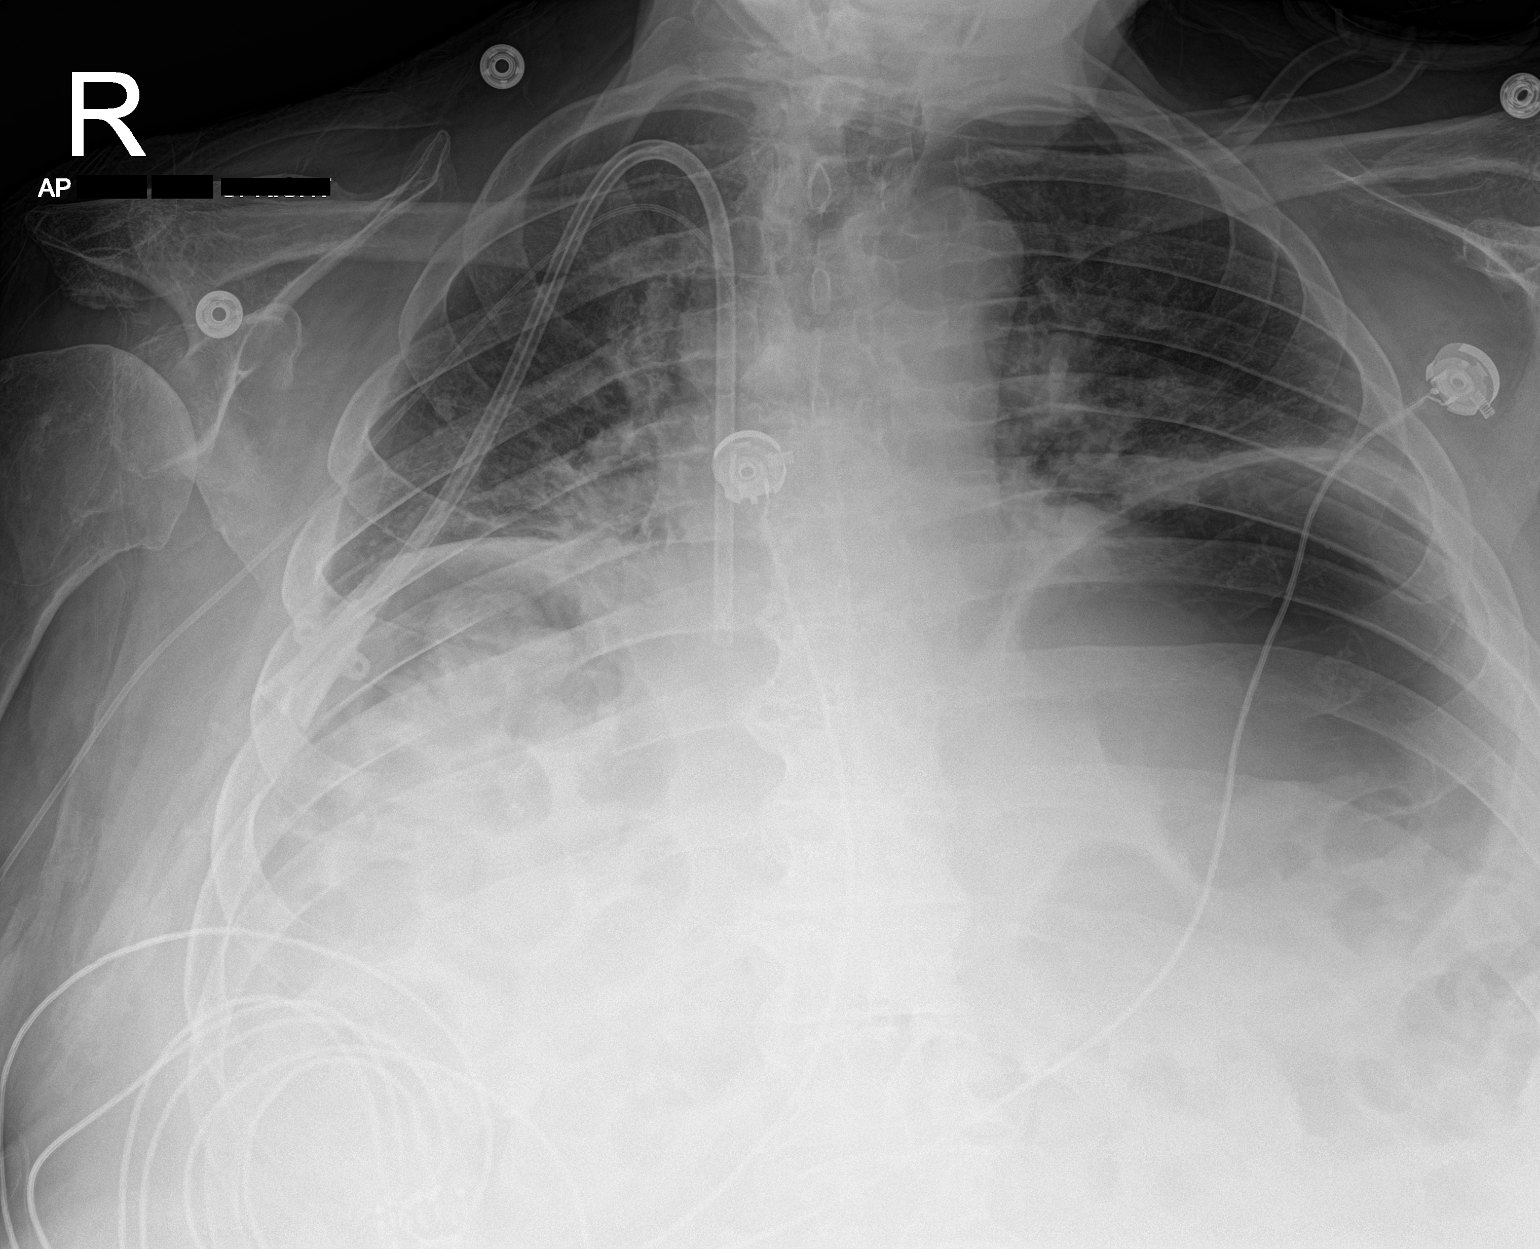

[1 of 1 positions shown; findings below may reference images not displayed]

FINDINGS: Mild cardiomegaly is noted. Severe hypoinflation of the lungs is
noted with mild bibasilar subsegmental atelectasis. Right internal
jugular Port-A-Cath is noted with distal tip in expected position of
cavoatrial junction. No pneumothorax or pleural effusion is noted.
Right-sided PICC line is noted as well with tip in SVC. Bony thorax
is unremarkable.
IMPRESSION: Severe hypoinflation of the lungs is noted with bibasilar
subsegmental atelectasis.
